# Patient Record
Sex: Female | Born: 1985 | Hispanic: No | Marital: Single
Health system: Southern US, Community
[De-identification: ages and names within clinical notes are randomized; demographics above are authoritative.]

## PROBLEM LIST (undated history)

## (undated) DIAGNOSIS — N83209 Unspecified ovarian cyst, unspecified side: Secondary | ICD-10-CM

## (undated) DIAGNOSIS — N289 Disorder of kidney and ureter, unspecified: Secondary | ICD-10-CM

---

## 2019-06-14 ENCOUNTER — Emergency Department (HOSPITAL_BASED_OUTPATIENT_CLINIC_OR_DEPARTMENT_OTHER)
Admission: EM | Admit: 2019-06-14 | Discharge: 2019-06-14 | Disposition: A | Discharge status: Home / Self Care | Payer: Self-pay | Attending: Emergency Medicine | Admitting: Emergency Medicine

## 2019-06-14 ENCOUNTER — Encounter (HOSPITAL_BASED_OUTPATIENT_CLINIC_OR_DEPARTMENT_OTHER): Payer: Self-pay | Admitting: *Deleted

## 2019-06-14 ENCOUNTER — Emergency Department (HOSPITAL_BASED_OUTPATIENT_CLINIC_OR_DEPARTMENT_OTHER): Payer: Self-pay

## 2019-06-14 ENCOUNTER — Other Ambulatory Visit: Payer: Self-pay

## 2019-06-14 DIAGNOSIS — R102 Pelvic and perineal pain: Secondary | ICD-10-CM

## 2019-06-14 DIAGNOSIS — N760 Acute vaginitis: Secondary | ICD-10-CM | POA: Insufficient documentation

## 2019-06-14 DIAGNOSIS — B9689 Other specified bacterial agents as the cause of diseases classified elsewhere: Secondary | ICD-10-CM

## 2019-06-14 HISTORY — DX: Unspecified ovarian cyst, unspecified side: N83.209

## 2019-06-14 HISTORY — DX: Disorder of kidney and ureter, unspecified: N28.9

## 2019-06-14 LAB — COMPREHENSIVE METABOLIC PANEL
ALT: 17 U/L (ref 0–44)
AST: 23 U/L (ref 15–41)
Albumin: 4 g/dL (ref 3.5–5.0)
Alkaline Phosphatase: 93 U/L (ref 38–126)
Anion gap: 6 (ref 5–15)
BUN: 11 mg/dL (ref 6–20)
CO2: 19 mmol/L — ABNORMAL LOW (ref 22–32)
Calcium: 9.3 mg/dL (ref 8.9–10.3)
Chloride: 112 mmol/L — ABNORMAL HIGH (ref 98–111)
Creatinine, Ser: 0.65 mg/dL (ref 0.44–1.00)
GFR calc Af Amer: >60 mL/min (ref >60)
GFR calc non Af Amer: >60 mL/min (ref >60)
Glucose, Bld: 103 mg/dL — ABNORMAL HIGH (ref 70–99)
Potassium: 3.8 mmol/L (ref 3.5–5.1)
Sodium: 137 mmol/L (ref 135–145)
Total Bilirubin: 0.9 mg/dL (ref 0.3–1.2)
Total Protein: 6.8 g/dL (ref 6.5–8.1)

## 2019-06-14 LAB — WET PREP, GENITAL
Sperm: NONE SEEN
Trich, Wet Prep: NONE SEEN
Yeast Wet Prep HPF POC: NONE SEEN

## 2019-06-14 LAB — CBC WITH DIFFERENTIAL/PLATELET
Abs Immature Granulocytes: 0.01 10*3/uL (ref 0.00–0.07)
Basophils Absolute: 0.1 10*3/uL (ref 0.0–0.1)
Basophils Relative: 1 %
Eosinophils Absolute: 0.1 10*3/uL (ref 0.0–0.5)
Eosinophils Relative: 1 %
HCT: 35.8 % — ABNORMAL LOW (ref 36.0–46.0)
Hemoglobin: 12.1 g/dL (ref 12.0–15.0)
Immature Granulocytes: 0 %
Lymphocytes Relative: 37 %
Lymphs Abs: 2.5 10*3/uL (ref 0.7–4.0)
MCH: 30.3 pg (ref 26.0–34.0)
MCHC: 33.8 g/dL (ref 30.0–36.0)
MCV: 89.5 fL (ref 80.0–100.0)
Monocytes Absolute: 0.6 10*3/uL (ref 0.1–1.0)
Monocytes Relative: 9 %
Neutro Abs: 3.5 10*3/uL (ref 1.7–7.7)
Neutrophils Relative %: 52 %
Platelets: 406 10*3/uL — ABNORMAL HIGH (ref 150–400)
RBC: 4 MIL/uL (ref 3.87–5.11)
RDW: 13 % (ref 11.5–15.5)
WBC: 6.7 10*3/uL (ref 4.0–10.5)
nRBC: 0 % (ref 0.0–0.2)

## 2019-06-14 LAB — URINALYSIS, ROUTINE W REFLEX MICROSCOPIC
Bilirubin Urine: NEGATIVE
Glucose, UA: NEGATIVE mg/dL
Ketones, ur: 15 mg/dL — AB
Leukocytes,Ua: NEGATIVE
Nitrite: NEGATIVE
Protein, ur: NEGATIVE mg/dL
Specific Gravity, Urine: 1.025 (ref 1.005–1.030)
pH: 6 (ref 5.0–8.0)

## 2019-06-14 LAB — URINALYSIS, MICROSCOPIC (REFLEX)

## 2019-06-14 LAB — PREGNANCY, URINE: Preg Test, Ur: NEGATIVE

## 2019-06-14 LAB — HIV ANTIBODY (ROUTINE TESTING W REFLEX): HIV Screen 4th Generation wRfx: NONREACTIVE

## 2019-06-14 MED ORDER — METRONIDAZOLE 500 MG PO TABS: 500.0000 mg | ORAL_TABLET | Freq: Two times a day (BID) | ORAL | 0 refills | Status: AC

## 2019-06-14 MED ORDER — MORPHINE SULFATE (PF) 4 MG/ML IV SOLN
4.0000 mg | Freq: Once | INTRAVENOUS | Status: AC
  Administered 2019-06-14: 19:00:00 4 mg via INTRAVENOUS
  Filled 2019-06-14: qty 1

## 2019-06-14 MED ORDER — IOHEXOL 300 MG/ML  SOLN: 100.0000 mL | Freq: Once | INTRAMUSCULAR | Status: DC

## 2019-06-14 MED ORDER — NAPROXEN 500 MG PO TABS: 500.0000 mg | ORAL_TABLET | Freq: Two times a day (BID) | ORAL | 0 refills | Status: AC

## 2019-06-14 MED ORDER — KETOROLAC TROMETHAMINE 15 MG/ML IJ SOLN
15.0000 mg | Freq: Once | INTRAMUSCULAR | Status: AC
  Administered 2019-06-14: 15 mg via INTRAVENOUS
  Filled 2019-06-14: qty 1

## 2019-06-14 MED ORDER — MORPHINE SULFATE (PF) 4 MG/ML IV SOLN
4.0000 mg | Freq: Once | INTRAVENOUS | Status: AC
  Administered 2019-06-14: 18:00:00 4 mg via INTRAVENOUS
  Filled 2019-06-14: qty 1

## 2019-06-14 MED ORDER — ONDANSETRON HCL 4 MG/2ML IJ SOLN
4.0000 mg | Freq: Once | INTRAMUSCULAR | Status: AC
  Administered 2019-06-14: 4 mg via INTRAVENOUS
  Filled 2019-06-14: qty 2

## 2019-06-14 MED ORDER — SODIUM CHLORIDE 0.9 % IV BOLUS
1000.0000 mL | Freq: Once | INTRAVENOUS | Status: AC
  Administered 2019-06-14: 18:00:00 1000 mL via INTRAVENOUS

## 2019-06-14 MED ORDER — ONDANSETRON 4 MG PO TBDP: 4.0000 mg | ORAL_TABLET | Freq: Three times a day (TID) | ORAL | 0 refills | Status: AC | PRN

## 2019-06-14 NOTE — ED Notes (Signed)
Pt states pain medication effective initially but following ultrasound, pain has returned.  IV fluids infusing.

## 2019-06-14 NOTE — ED Provider Notes (Signed)
[facility] EMERGENCY DEPARTMENT Provider Note   CSN: [id number] Arrival date & time: 06/14/19  1618     History Chief Complaint  Patient presents with  . Abdominal Pain    Susan Spears is a 33 y.o. 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  Pain was initially intermittent, now constant, describes as a cramping nature, severe, worse with ambulation, no alleviating factors.  She recently moved here from [place], about 4 weeks ago she was found to have a large left ovarian cyst with findings concerning for torsion, she was taken to surgery emergently, no signs of torsion, she did have intervention to the cyst at that time.  She states since the surgical procedure she has had intermittent vaginal bleeding, over the past few days this has been more constant and a bit heavier.  She also reports malodorous vaginal discharge.  Has felt nauseated.  Denies fever, chills, emesis, diarrhea, melena, hematochezia, or dysuria.  She is sexually active in a monogamous relationship without concern for STD.  She has not established with an OBGYN in the area yet. HPI     Past Medical History:  Diagnosis Date  . Ovarian cyst   . Renal disorder     There are no problems to display for this patient.   History reviewed. No pertinent surgical history.   OB History   No obstetric history on file.     No family history on file.  Social History   Tobacco Use  . Smoking status: Not on file  Substance Use Topics  . Alcohol use: Not on file  . Drug use: Not on file    Home Medications Prior to Admission medications   Not on File    Allergies    Patient has no known allergies.  Review of Systems   Review of Systems  Constitutional: Negative for chills and fever.  Respiratory:  Negative for shortness of breath.   Cardiovascular: Negative for chest pain.  Gastrointestinal: Positive for nausea. Negative for blood in stool, constipation, diarrhea and vomiting.  Genitourinary: Positive for pelvic pain, vaginal bleeding and vaginal discharge. Negative for frequency.  All other systems reviewed and are negative.   Physical Exam Updated Vital Signs BP 126/82 (BP Location: Right Arm)   Pulse 71   Temp 98.7 F (37.1 C) (Oral)   Resp 14   Ht 5\' 2"  (1.575 m)   Wt 65.8 kg   SpO2 98%   BMI 26.52 kg/m   Physical Exam Vitals and nursing note reviewed. Exam conducted with a chaperone present.  Constitutional:      General: She is not in acute distress.    Appearance: She is well-developed. She is not toxic-appearing.  HENT:     Head: Normocephalic and atraumatic.  Eyes:     General:        Right eye: No discharge.        Left eye: No discharge.     Conjunctiva/sclera: Conjunctivae normal.  Cardiovascular:     Rate and Rhythm: Normal rate and regular rhythm.  Pulmonary:     Effort: Pulmonary effort is normal. No respiratory distress.     Breath sounds: Normal breath sounds. No wheezing, rhonchi or rales.  Abdominal:     General: There is no distension.     Palpations: Abdomen  is soft.     Tenderness: There is abdominal tenderness (left lower quadrant/pelvic region). There is no guarding or rebound.  Genitourinary:    Labia:        Right: No lesion.        Left: No lesion.      Cervix: Cervical motion tenderness present.     Adnexa:        Right: No tenderness or fullness.         Left: Tenderness and fullness present.      Comments: Mild blood present in the vaginal vault.  No significant bleeding.  No significant discharge. Musculoskeletal:     Cervical back: Neck supple.  Skin:    General: Skin is warm and dry.     Findings: No rash.  Neurological:     Mental Status: She is alert.     Comments: Clear speech.   Psychiatric:        Behavior:  Behavior normal.     ED Results / Procedures / Treatments   Labs (all labs ordered are listed, but only abnormal results are displayed) Labs Reviewed  WET PREP, GENITAL - Abnormal; Notable for the following components:      Result Value   Clue Cells Wet Prep HPF POC PRESENT (*)    WBC, Wet Prep HPF POC FEW (*)    All other components within normal limits  URINALYSIS, ROUTINE W REFLEX MICROSCOPIC - Abnormal; Notable for the following components:   Hgb urine dipstick LARGE (*)    Ketones, ur 15 (*)    All other components within normal limits  CBC WITH DIFFERENTIAL/PLATELET - Abnormal; Notable for the following components:   HCT 35.8 (*)    Platelets 406 (*)    All other components within normal limits  URINALYSIS, MICROSCOPIC (REFLEX) - Abnormal; Notable for the following components:   Bacteria, UA FEW (*)    All other components within normal limits  PREGNANCY, URINE  COMPREHENSIVE METABOLIC PANEL  RPR  HIV ANTIBODY (ROUTINE TESTING W REFLEX)  GC/CHLAMYDIA PROBE AMP ([place]) NOT AT [facility]    EKG None  Radiology US PELVIC COMPLETE W TRANSVAGINAL AND TORSION R/O  Result Date: 06/14/2019 CLINICAL DATA:  33 year old female with history of prior right oophorectomy presenting with pelvic pain x2 days. EXAM: TRANSABDOMINAL AND TRANSVAGINAL ULTRASOUND OF PELVIS DOPPLER ULTRASOUND OF OVARIES TECHNIQUE: Both transabdominal and transvaginal ultrasound examinations of the pelvis were performed. Transabdominal technique was performed for global imaging of the pelvis including uterus, ovaries, adnexal regions, and pelvic cul-de-sac. It was necessary to proceed with endovaginal exam following the transabdominal exam to visualize the endometrium and ovaries. Color and duplex Doppler ultrasound was utilized to evaluate blood flow to the ovaries. COMPARISON:  None. FINDINGS: Uterus Measurements: 7.1 x 4.2 x 6.2 cm = volume: 96 mL. The uterus is retroverted. The uterus demonstrates a slightly  heterogeneous echotexture with findings suggestive of mild adenomyosis. No focal mass. Endometrium Thickness: 2 mm.  The visualized endometrium appears unremarkable. Right ovary Surgically absent. Left ovary Measurements: 3.7 x 3.1 x 3.2 cm = volume: 20 mL. Normal appearance/no adnexal mass. Pulsed Doppler evaluation of the left ovary demonstrates normal low-resistance arterial and venous waveforms. Other findings No abnormal free fluid. IMPRESSION: 1. Heterogeneous uterus with findings of possible mild adenomyosis. 2. Unremarkable endometrium and the left ovary. 3. Status post prior right oophorectomy. Electronically Signed   By: Elgie CollardArash  Radparvar M.D.   On: 06/14/2019 18:55    Procedures Procedures (including critical care  time)  Medications Ordered in ED Medications  iohexol (OMNIPAQUE) 300 MG/ML solution 100 mL (has no administration in time range)  morphine 4 MG/ML injection 4 mg (4 mg Intravenous Given 06/14/19 1759)  ondansetron (ZOFRAN) injection 4 mg (4 mg Intravenous Given 06/14/19 1758)  sodium chloride 0.9 % bolus 1,000 mL (0 mLs Intravenous Stopped 06/14/19 1958)  morphine 4 MG/ML injection 4 mg (4 mg Intravenous Given 06/14/19 1923)  ketorolac (TORADOL) 15 MG/ML injection 15 mg (15 mg Intravenous Given 06/14/19 2202)    ED Course  I have reviewed the triage vital signs and the nursing notes.  Pertinent labs & imaging results that were available during my care of the patient were reviewed by me and considered in my medical decision making (see chart for details).    MDM Rules/Calculators/A&P                      Patient presents to the emergency department with left-sided pelvic pain since last night.  Complicated history with recent concerns for ovarian torsion with surgical intervention of a large left-sided cyst 4 weeks prior.  Vitals WNL.  Exam with left-sided lower quadrant/suprapubic tenderness to palpation.  She has cervical motion tenderness and left-sided adnexal  tenderness with fullness.  Mild blood present in the vaginal vault.  No significant discharge.  DDx: Ovarian cyst, torsion, ectopic pregnancy, PID, nephrolithiasis, pyelonephritis, diverticulitis.  CBC: No leukocytosis or significant anemia.  Platelets mildly elevated. CMP: No significant electrolyte derangement.  Renal function and LFTs WNL. Pregnancy test: Negative Urinalysis: Hematuria consistent with vaginal bleeding.  No obvious UTI. Wet prep: Bacterial vaginosis.-->  Will treat based on recent GU procedure and patient reports of vaginal discharge. GC/chlamydia/HIV/RPR pending Ultrasound:1. Heterogeneous uterus with findings of possible mild adenomyosis. 2. Unremarkable endometrium and the left ovary. 3. Status post prior right oophorectomy  Ultrasound without torsion.  No signs of free fluid.  Following analgesics patient feeling improved, repeat abdominal exam improved as well, no peritoneal signs.  She is tolerating p.o.  She is in a monogamous relationship, she is not concerned for STDs, she would prefer to defer STD treatment to results which I am in agreement with, she did not have significant discharge, feel PID is less likely.  Will discharge home with analgesics, Flagyl, and OB/GYN follow-up. I discussed results, treatment plan, need for follow-up, and return precautions with the patient. Provided opportunity for questions, patient confirmed understanding and is in agreement with plan.   Findings and plan of care discussed with supervising physician Dr. Rogene Houston who is in agreement.   Final Clinical Impression(s) / ED Diagnoses Final diagnoses:  Pelvic pain  Bacterial vaginosis    Rx / DC Orders ED Discharge Orders         Ordered    naproxen (NAPROSYN) 500 MG tablet  2 times daily     06/14/19 2246    metroNIDAZOLE (FLAGYL) 500 MG tablet  2 times daily     06/14/19 2246    ondansetron (ZOFRAN ODT) 4 MG disintegrating tablet  Every 8 hours PRN     06/14/19 2246            Amaryllis Dyke, PA-C 06/14/19 2319    Fredia Sorrow, MD 06/17/19 [phone]

## 2019-06-14 NOTE — ED Triage Notes (Signed)
Left abd pain sharp last pm, denies n/v/d,   Vag dc, w oder in urine,  Vaginal bleeding off and on  Had abd surgery 4 weeks in [place]

## 2019-06-14 NOTE — ED Notes (Signed)
Pt transported to US

## 2019-06-14 NOTE — Discharge Instructions (Addendum)
You were seen in the emergency department today for pelvic pain.  Your ultrasound did not show any cyst or twisting of your ovary.  It did show findings of adenomyosis which is an abnormality of the uterus/endometrial tissue.  This may be contributing to your pain.  Your wet prep showed bacterial vaginosis, this is a nonsexually transmitted infection which we are treating you for with Flagyl, please take this antibiotic as prescribed, please do not drink alcohol with this medication condition as it can be extremity dangerous.  We are also sending you home with naproxen to help with pain and zofran to help with nausea.  - Naproxen is a nonsteroidal anti-inflammatory medication that will help with pain and swelling. Be sure to take this medication as prescribed with food, 1 pill every 12 hours,  It should be taken with food, as it can cause stomach upset, and more seriously, stomach bleeding. Do not take other nonsteroidal anti-inflammatory medications with this such as Advil, Motrin, Aleve, Mobic, Goodie Powder, or Motrin.    -Zofran: This is a medicine to take every 8 hours as needed for nausea and vomiting.  You make take Tylenol per over the counter dosing with these medications.   We have prescribed you new medication(s) today. Discuss the medications prescribed today with your pharmacist as they can have adverse effects and interactions with your other medicines including over the counter and prescribed medications. Seek medical evaluation if you start to experience new or abnormal symptoms after taking one of these medicines, seek care immediately if you start to experience difficulty breathing, feeling of your throat closing, facial swelling, or rash as these could be indications of a more serious allergic reaction  We would like you to follow-up with women's health within 1 to 3 days.  Please call the office to schedule an appointment.  Return to the ER for new or worsening symptoms including but  not limited to worsening pain, different pain, inability to keep fluids down, fever, passing out, chest pain, trouble breathing, or any other concerns.

## 2019-06-14 NOTE — ED Notes (Signed)
Pt tolerating cup of water w/o difficulty.

## 2019-06-15 LAB — RPR: RPR Ser Ql: NONREACTIVE

## 2019-06-16 LAB — GC/CHLAMYDIA PROBE AMP (~~LOC~~) NOT AT ARMC
Chlamydia: NEGATIVE
Neisseria Gonorrhea: NEGATIVE

## 2020-06-22 IMAGING — US US PELVIS COMPLETE TRANSABD/TRANSVAG W DUPLEX
1 series · 13 of 25 positions shown · non-contrast
Comparison: None.

CLINICAL DATA: 33-year-old female with history of prior right
oophorectomy presenting with pelvic pain x2 days.

EXAM:
TRANSABDOMINAL AND TRANSVAGINAL ULTRASOUND OF PELVIS
DOPPLER ULTRASOUND OF OVARIES
TECHNIQUE: Both transabdominal and transvaginal ultrasound examinations of the
pelvis were performed. Transabdominal technique was performed for
global imaging of the pelvis including uterus, ovaries, adnexal
regions, and pelvic cul-de-sac.
It was necessary to proceed with endovaginal exam following the
transabdominal exam to visualize the endometrium and ovaries. Color
and duplex Doppler ultrasound was utilized to evaluate blood flow to
the ovaries.

[Series 1: us pelvis complete transabd/transvag w duplex · 13 of 55 slices shown]
[im 1/55]
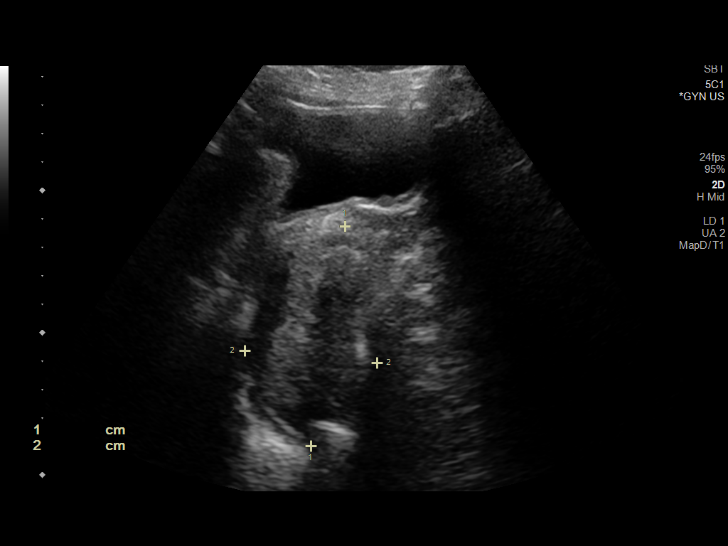
[im 5/55]
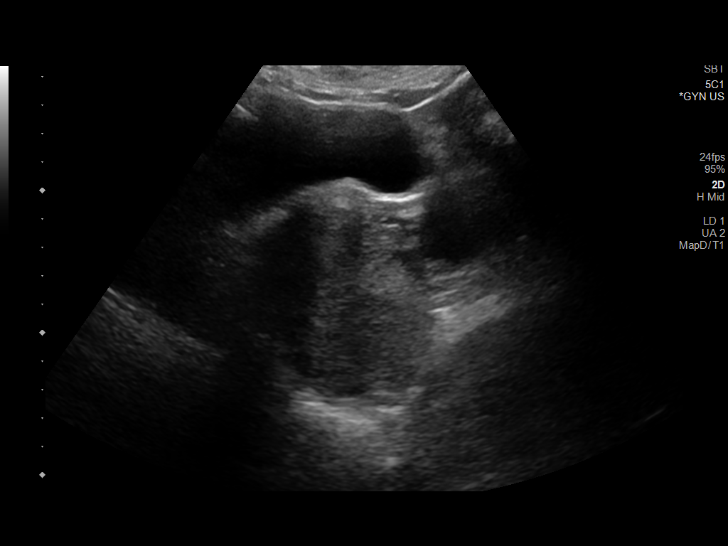
[im 10/55]
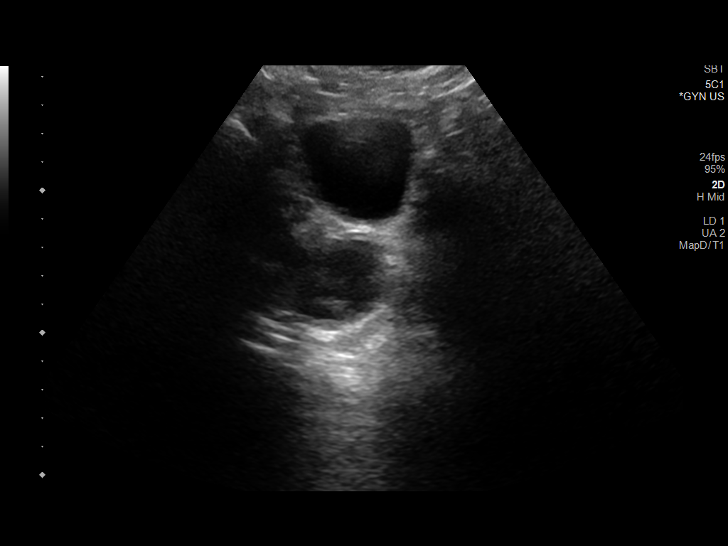
[im 14/55]
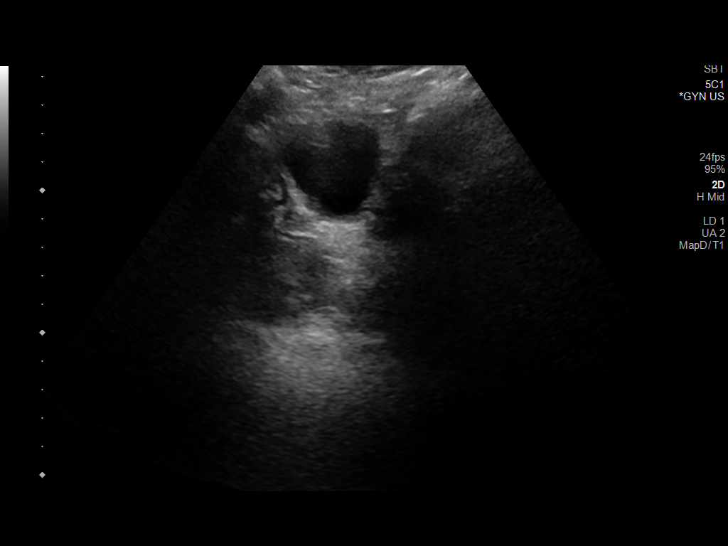
[im 19/55]
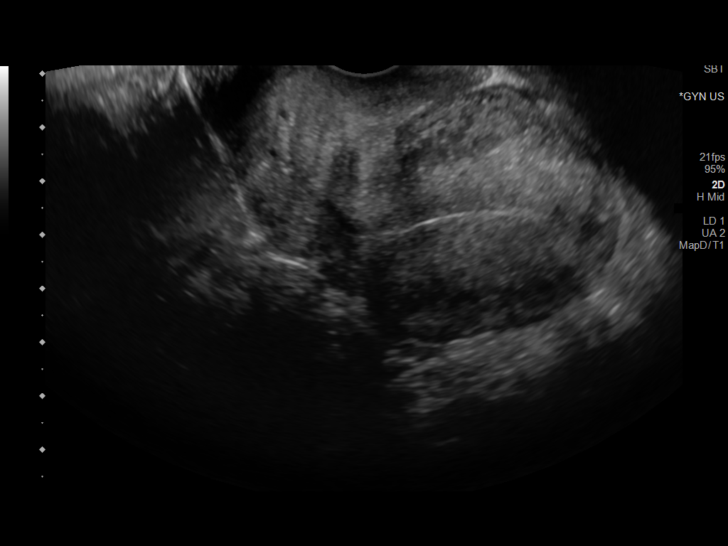
[im 23/55]
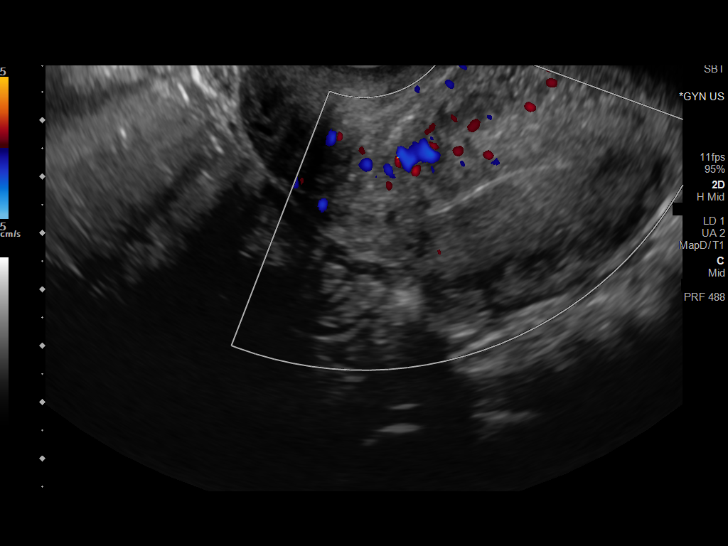
[im 28/55]
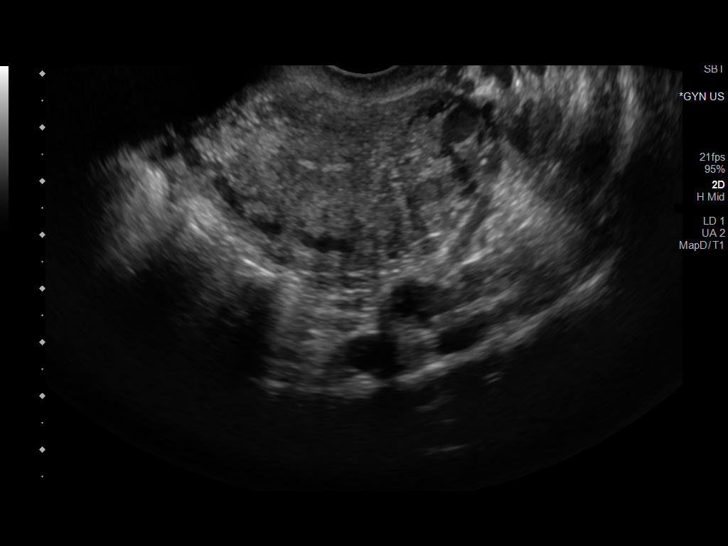
[im 32/55]
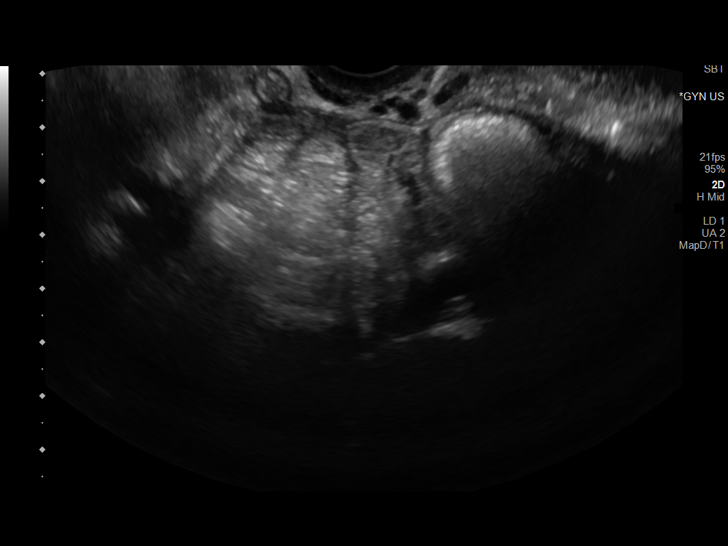
[im 37/55]
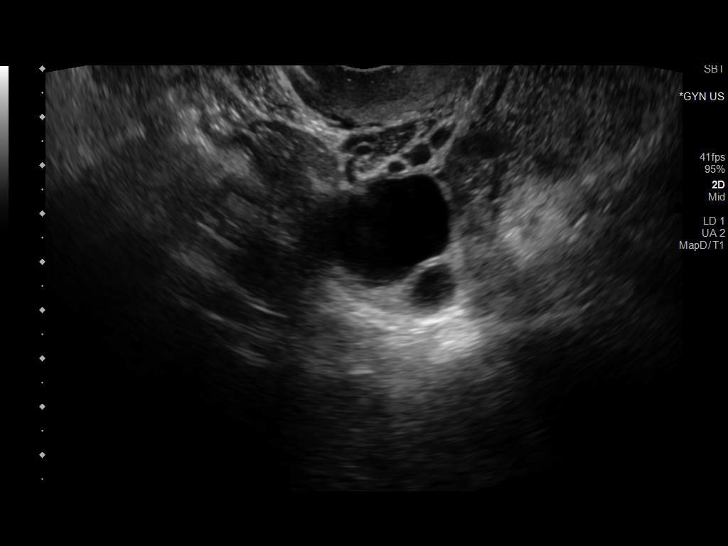
[im 41/55]
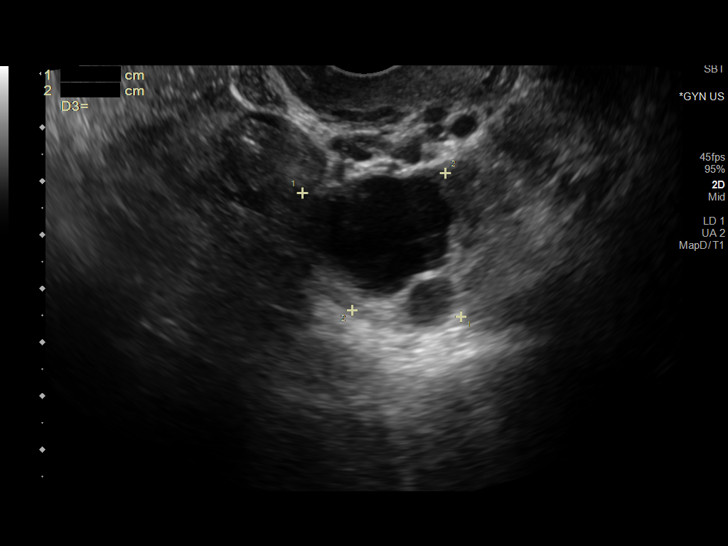
[im 46/55]
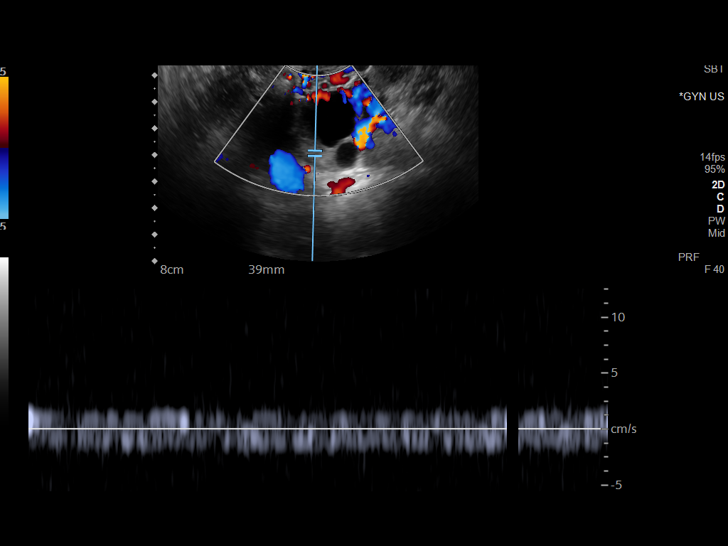
[im 50/55]
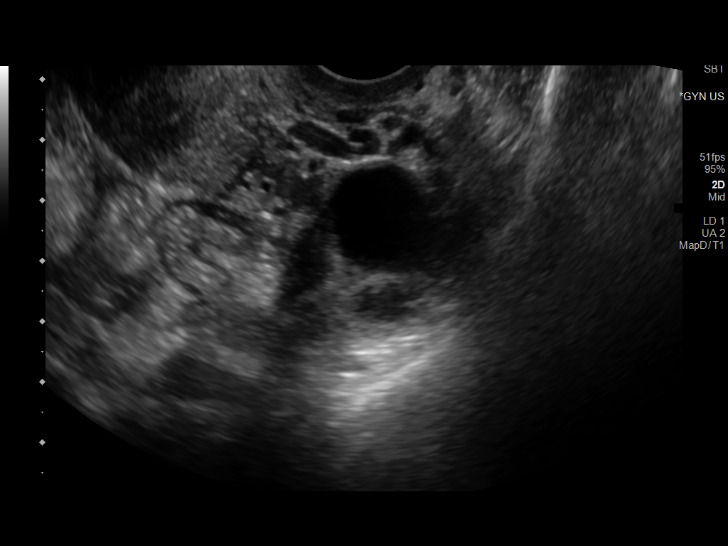
[im 55/55]
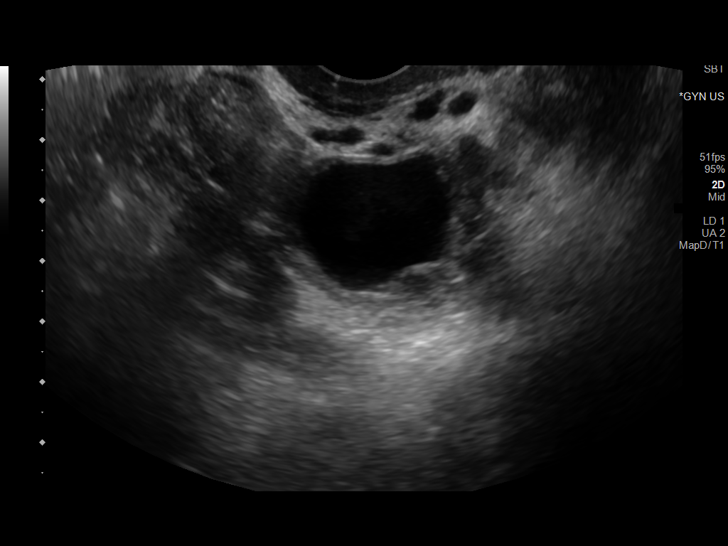

[13 of 25 positions shown; findings below may reference images not displayed]

FINDINGS: Uterus

Measurements: 7.1 x 4.2 x 6.2 cm = volume: 96 mL. The uterus is
retroverted. The uterus demonstrates a slightly heterogeneous
echotexture with findings suggestive of mild adenomyosis. No focal
mass.

Endometrium

Thickness: 2 mm.  The visualized endometrium appears unremarkable.

Right ovary

Surgically absent.

Left ovary

Measurements: 3.7 x 3.1 x 3.2 cm = volume: 20 mL. Normal
appearance/no adnexal mass.

Pulsed Doppler evaluation of the left ovary demonstrates normal
low-resistance arterial and venous waveforms.

Other findings

No abnormal free fluid.
IMPRESSION: 1. Heterogeneous uterus with findings of possible mild adenomyosis.
2. Unremarkable endometrium and the left ovary.
3. Status post prior right oophorectomy.
